# Patient Record
Sex: Female | Born: 1990 | Race: Black or African American | Hispanic: No | Marital: Single | Smoking: Never smoker
Health system: Southern US, Community
[De-identification: ages and names within clinical notes are randomized; demographics above are authoritative.]

## PROBLEM LIST (undated history)

## (undated) HISTORY — PX: KNEE SURGERY: SHX244

---
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
## 2018-12-05 ENCOUNTER — Telehealth: Payer: Self-pay

## 2018-12-05 NOTE — Telephone Encounter (Signed)
TC with patient re: +PPD. Discussed need to review medical hx and send for CXR. Appt scheduled for 8:20 12/06/18 Aileen Fass, RN

## 2018-12-08 ENCOUNTER — Telehealth: Payer: Self-pay

## 2018-12-08 NOTE — Telephone Encounter (Signed)
TC from patient. +PPD/EPI appt scheduled for 12/12/18 Aileen Fass, RN

## 2018-12-08 NOTE — Telephone Encounter (Signed)
TB RN received fax from [facility] on 12/04/18 d/t a +PPD (73mm).  TC to patient re: a missed +PPD appt on 12/06/18.  Patient stated she needed something before 1pm today. RN offered 11am work-in, patient declined.  Patient then stated she would get her work schedule and call TB RN back to schedule next week. Aileen Fass, RN

## 2018-12-12 ENCOUNTER — Ambulatory Visit (LOCAL_COMMUNITY_HEALTH_CENTER): Payer: Medicaid Other

## 2018-12-12 ENCOUNTER — Other Ambulatory Visit: Payer: Self-pay

## 2018-12-12 VITALS — Ht 71.0 in | Wt 240.0 lb

## 2018-12-12 DIAGNOSIS — R7611 Nonspecific reaction to tuberculin skin test without active tuberculosis: Secondary | ICD-10-CM

## 2018-12-12 NOTE — Progress Notes (Signed)
Patient referred from [facility] for 22mm PPD on 12/03/18; states has always had some redness from skin tests in the past.  No induration from PPD placement at todays visit but patient did have bruising.  Discussed LTBI and Active TB. Referred for CXR at [facility]. Patient unsure when she will go for this. Aileen Fass, RN

## 2018-12-20 ENCOUNTER — Telehealth: Payer: Self-pay

## 2018-12-22 NOTE — Telephone Encounter (Signed)
TC with patient re: CXR. Patient reports has not gone for CXR because she has been home with her children.  Stressed importance of going for CXR soon. Patient verbalized understanding. Aileen Fass, RN

## 2019-01-03 ENCOUNTER — Other Ambulatory Visit: Payer: Self-pay

## 2019-01-03 ENCOUNTER — Ambulatory Visit
Admission: RE | Admit: 2019-01-03 | Discharge: 2019-01-03 | Disposition: A | Discharge status: Home / Self Care | Payer: Medicaid Other | Source: Ambulatory Visit | Attending: Family Medicine | Admitting: Family Medicine

## 2019-01-03 DIAGNOSIS — R7611 Nonspecific reaction to tuberculin skin test without active tuberculosis: Secondary | ICD-10-CM

## 2019-01-12 ENCOUNTER — Telehealth: Payer: Self-pay

## 2019-01-23 DIAGNOSIS — Z227 Latent tuberculosis: Secondary | ICD-10-CM | POA: Insufficient documentation

## 2019-01-23 NOTE — Telephone Encounter (Signed)
Mailed TB Employer letter to patient. Closing to TB f/u due to patient not returning calls. Aileen Fass, RN

## 2019-06-16 ENCOUNTER — Other Ambulatory Visit: Payer: Self-pay

## 2019-06-16 ENCOUNTER — Encounter: Payer: Self-pay | Admitting: Emergency Medicine

## 2019-06-16 ENCOUNTER — Ambulatory Visit
Admission: EM | Admit: 2019-06-16 | Discharge: 2019-06-16 | Disposition: A | Discharge status: Home / Self Care | Payer: Medicaid Other | Attending: Family Medicine | Admitting: Family Medicine

## 2019-06-16 DIAGNOSIS — O219 Vomiting of pregnancy, unspecified: Secondary | ICD-10-CM | POA: Insufficient documentation

## 2019-06-16 LAB — URINALYSIS, COMPLETE (UACMP) WITH MICROSCOPIC
Bilirubin Urine: NEGATIVE
Glucose, UA: NEGATIVE mg/dL
Hgb urine dipstick: NEGATIVE
Ketones, ur: NEGATIVE mg/dL
Leukocytes,Ua: NEGATIVE
Nitrite: NEGATIVE
RBC / HPF: NONE SEEN RBC/hpf (ref 0–5)
Specific Gravity, Urine: 1.025 (ref 1.005–1.030)
pH: 6.5 (ref 5.0–8.0)

## 2019-06-16 NOTE — Discharge Instructions (Signed)
Vitamin B6 (pyridoxine) 25mg  take 1 tablet 3 to 4 times daily Unisom (doxylamine) 25mg  take half a tablet 3 to 4 times daily Ginger Await urine culture

## 2019-06-16 NOTE — ED Triage Notes (Signed)
Patient c/o abdominal pain, back pain, nausea, and vomiting that started a week.  Patient states that she is [redacted] weeks pregnant.

## 2019-06-16 NOTE — ED Provider Notes (Signed)
[facility]    CSN: [id number] Arrival date & time: 06/16/19  1144      History   Chief Complaint Chief Complaint  Patient presents with  . Abdominal Pain  . Emesis  . Back Pain    HPI Suzanne Cuevas is a 29 y.o. female.   29 yo female with a c/o nausea, vomiting, back pain and abdominal pain. Denies any fevers, chills, hematemesis, dysuria, hematuria, vaginal bleeding. States has had some mild constipation. Patient is [redacted] weeks pregnant and states her OB called in an rx for Diclegis (doxilamine/pyridoxine) yesterday for nausea/vomiting but patient did not fill due to cost.   Abdominal Pain Associated symptoms: vomiting   Emesis Associated symptoms: abdominal pain   Back Pain Associated symptoms: abdominal pain     History reviewed. No pertinent past medical history.  Patient Active Problem List   Diagnosis Date Noted  . Latent tuberculosis 01/23/2019    Past Surgical History:  Procedure Laterality Date  . KNEE SURGERY Bilateral     OB History    Gravida  1   Para      Term      Preterm      AB      Living        SAB      TAB      Ectopic      Multiple      Live Births               Home Medications    Prior to Admission medications   Medication Sig Start Date End Date Taking? Authorizing Provider  Prenatal Vit-Fe Fumarate-FA (PRENATAL VITAMIN PO) Take by mouth.   Yes [provider]    Family History Family History  Problem Relation Age of Onset  . Healthy Mother   . Healthy Father     Social History Social History   Tobacco Use  . Smoking status: Never Smoker  . Smokeless tobacco: Never Used  Substance Use Topics  . Alcohol use: Not Currently    Comment: occasional  . Drug use: Not Currently     Allergies   Codeine and Penicillins   Review of Systems Review of Systems  Gastrointestinal: Positive for abdominal pain and vomiting.  Musculoskeletal: Positive for back pain.     Physical  Exam Triage Vital Signs ED Triage Vitals  Enc Vitals Group     BP 06/16/19 1201 119/86     Pulse Rate 06/16/19 1201 84     Resp 06/16/19 1201 16     Temp 06/16/19 1201 98.4 F (36.9 C)     Temp Source 06/16/19 1201 Oral     SpO2 06/16/19 1201 100 %     Weight 06/16/19 1156 220 lb (99.8 kg)     Height 06/16/19 1156 5\' 11"  (1.803 m)     Head Circumference --      Peak Flow --      Pain Score 06/16/19 1156 8     Pain Loc --      Pain Edu? --      Excl. in [place]? --    No data found.  Updated Vital Signs BP 119/86 (BP Location: Right Arm)   Pulse 84   Temp 98.4 F (36.9 C) (Oral)   Resp 16   Ht 5\' 11"  (1.803 m)   Wt 99.8 kg   LMP 12/05/2018 (Exact Date)   SpO2 100%   BMI 30.68 kg/m   Visual Acuity  Right Eye Distance:   Left Eye Distance:   Bilateral Distance:    Right Eye Near:   Left Eye Near:    Bilateral Near:     Physical Exam Vitals and nursing note reviewed.  Constitutional:      General: She is not in acute distress.    Appearance: She is not toxic-appearing or diaphoretic.  Cardiovascular:     Rate and Rhythm: Normal rate.  Pulmonary:     Effort: Pulmonary effort is normal. No respiratory distress.  Abdominal:     General: Bowel sounds are normal. There is no distension.     Palpations: Abdomen is soft. There is no mass.     Tenderness: There is abdominal tenderness (mild; diffuse). There is no right CVA tenderness, left CVA tenderness, guarding or rebound.     Hernia: No hernia is present.  Neurological:     Mental Status: She is alert.      UC Treatments / Results  Labs (all labs ordered are listed, but only abnormal results are displayed) Labs Reviewed  URINALYSIS, COMPLETE (UACMP) WITH MICROSCOPIC - Abnormal; Notable for the following components:      Result Value   APPearance CLOUDY (*)    Protein, ur TRACE (*)    Bacteria, UA RARE (*)    All other components within normal limits  URINE CULTURE    EKG   Radiology No results  found.  Procedures Procedures (including critical care time)  Medications Ordered in UC Medications - No data to display  Initial Impression / Assessment and Plan / UC Course  I have reviewed the triage vital signs and the nursing notes.  Pertinent labs & imaging results that were available during my care of the patient were reviewed by me and considered in my medical decision making (see chart for details).      Final Clinical Impressions(s) / UC Diagnoses   Final diagnoses:  Nausea and vomiting in pregnancy     Discharge Instructions     Vitamin B6 (pyridoxine) 25mg  take 1 tablet 3 to 4 times daily Unisom (doxylamine) 25mg  take half a tablet 3 to 4 times daily Ginger Await urine culture    ED Prescriptions    None      1. Lab results and diagnosis reviewed with patient 2. Recommend supportive treatment as above with otc pyridoxine and doxylamine 3. Await urine culture 4. Follow up with OB 5. Follow-up prn    PDMP not reviewed this encounter.   , MD 06/16/19 1258

## 2019-06-18 LAB — URINE CULTURE
Culture: NO GROWTH
Special Requests: NORMAL

## 2020-05-03 IMAGING — CR DG CHEST 1V
1 series · 1 of 1 positions shown · non-contrast
Comparison: None.

CLINICAL DATA: Positive tuberculin skin test

EXAM:
CHEST  1 VIEW

[dg chest 1 view]
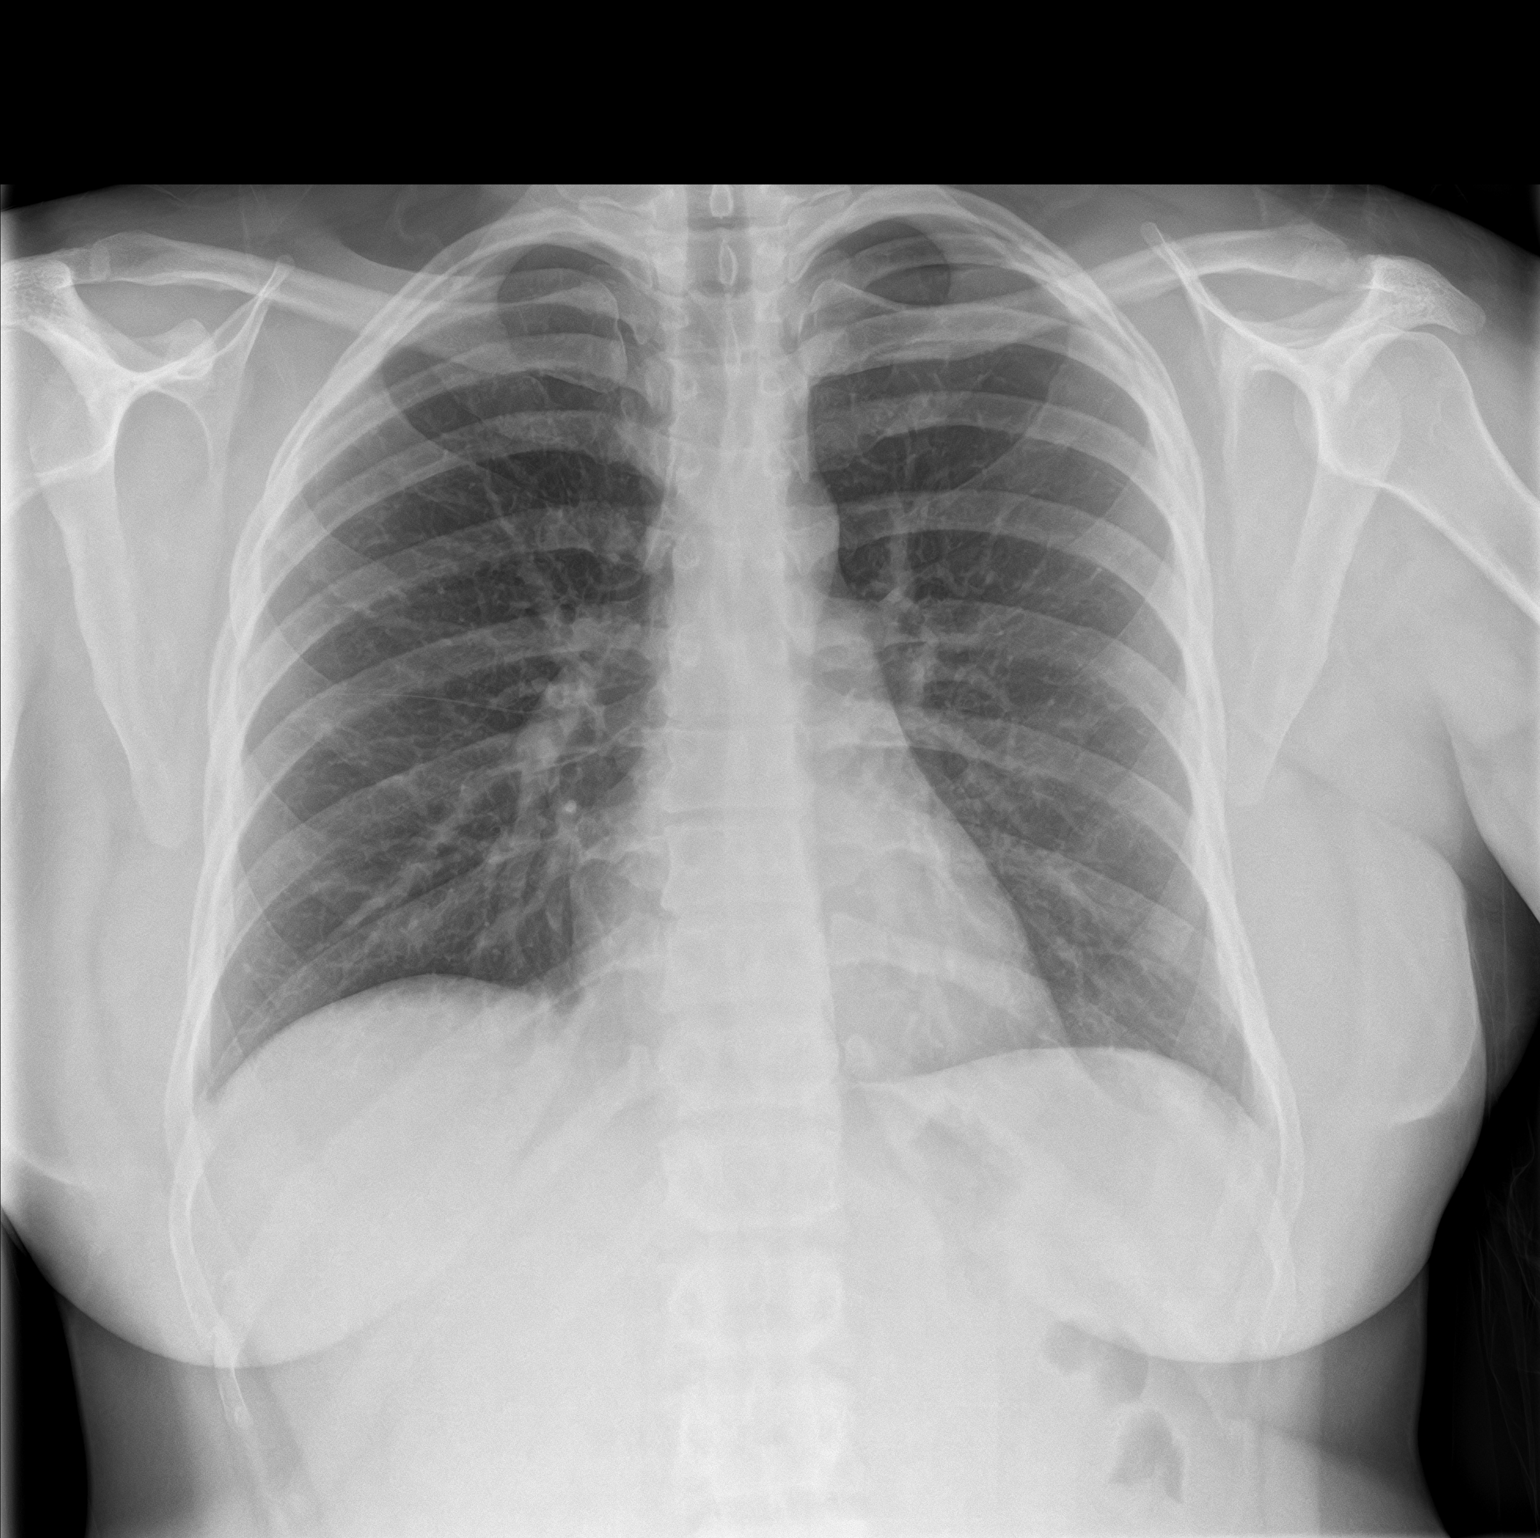

[1 of 1 positions shown; findings below may reference images not displayed]

FINDINGS: Lungs are clear. Heart size and pulmonary vascularity are normal. No
adenopathy. No bone lesions.
IMPRESSION: No abnormality noted.

## 2023-09-30 ENCOUNTER — Ambulatory Visit
Admission: EM | Admit: 2023-09-30 | Discharge: 2023-09-30 | Disposition: A | Discharge status: Home / Self Care | Attending: Family Medicine | Admitting: Family Medicine

## 2023-09-30 ENCOUNTER — Encounter: Payer: Self-pay | Admitting: Emergency Medicine

## 2023-09-30 DIAGNOSIS — J069 Acute upper respiratory infection, unspecified: Secondary | ICD-10-CM | POA: Insufficient documentation

## 2023-09-30 LAB — GROUP A STREP BY PCR: Group A Strep by PCR: NOT DETECTED

## 2023-09-30 LAB — SARS CORONAVIRUS 2 BY RT PCR: SARS Coronavirus 2 by RT PCR: NEGATIVE

## 2023-09-30 MED ORDER — IBUPROFEN 800 MG PO TABS
800.0000 mg | ORAL_TABLET | Freq: Once | ORAL | Status: AC
  Administered 2023-09-30: 800 mg via ORAL

## 2023-09-30 MED ORDER — PROMETHAZINE-DM 6.25-15 MG/5ML PO SYRP: 5.0000 mL | ORAL_SOLUTION | Freq: Four times a day (QID) | ORAL | 0 refills | Status: AC | PRN

## 2023-09-30 NOTE — Discharge Instructions (Addendum)
 Cloma D Dinh's strep and COVID are negative. You have a viral respiratory infection that will gradually improve over the next 7-10 days. Cough may last up to 3 weeks.   If your were prescribed medication, stop by the pharmacy to pick them up.   You can take Tylenol and/or Ibuprofen as needed for fever reduction and pain relief.    For cough: honey 1/2 to 1 teaspoon (you can dilute the honey in water or another fluid).  Stop at the pharmacy to pick up your prescription cough medication. You can use a humidifier for chest congestion and cough.  If you don't have a humidifier, you can sit in the bathroom with the hot shower running.      For sore throat: try warm salt water gargles, Mucinex sore throat cough drops or cepacol lozenges, throat spray, warm tea or water with lemon/honey, popsicles or ice, or OTC cold relief medicine for throat discomfort. You can also purchase chloraseptic spray at the pharmacy or dollar store.   For congestion: take a daily anti-histamine like Zyrtec, Claritin, and a oral decongestant, such as pseudoephedrine.  You can also use Flonase 1-2 sprays in each nostril daily. Afrin is also a good option, if you do not have high blood pressure.    It is important to stay hydrated: drink plenty of fluids (water, gatorade/powerade/pedialyte, juices, or teas) to keep your throat moisturized and help further relieve irritation/discomfort.    Return or go to the Emergency Department if symptoms worsen or do not improve in the next few days

## 2023-09-30 NOTE — ED Triage Notes (Signed)
 Patient c/o sore throat, sinus congestion, and headache that started on Wed.  Patient reports possible fever yesterday.

## 2023-09-30 NOTE — ED Provider Notes (Signed)
 [facility]    CSN: [id number] Arrival date & time: 09/30/23  1004      History   Chief Complaint Chief Complaint  Patient presents with   Sore Throat   Sinus Problem    HPI Suzanne Cuevas is a 33 y.o. female.   HPI  History obtained from the patient. Suzanne Cuevas presents for nasal congestion, sore throat, ear pain, productive cough with green sputum, headache and fever. Symptoms started on Wednesday but fever was 101 F last night. Took Mucinex, Theraflu and used an humidifier without relief. Denies rhinorrhea, vomiting or diarrhea.  Her son who attends daycare and her boss have have been sick.     History reviewed. No pertinent past medical history.  Patient Active Problem List   Diagnosis Date Noted   Latent tuberculosis 01/23/2019    Past Surgical History:  Procedure Laterality Date   KNEE SURGERY Bilateral     OB History     Gravida  1   Para      Term      Preterm      AB      Living         SAB      IAB      Ectopic      Multiple      Live Births               Home Medications    Prior to Admission medications   Medication Sig Start Date End Date Taking? Authorizing Provider  promethazine-dextromethorphan (PROMETHAZINE-DM) 6.25-15 MG/5ML syrup Take 5 mLs by mouth 4 (four) times daily as needed. 09/30/23  Yes Nandan Willems, DO  Prenatal Vit-Fe Fumarate-FA (PRENATAL VITAMIN PO) Take by mouth.    [provider]    Family History Family History  Problem Relation Age of Onset   Healthy Mother    Healthy Father     Social History Social History   Tobacco Use   Smoking status: Never   Smokeless tobacco: Never  Vaping Use   Vaping status: Never Used  Substance Use Topics   Alcohol use: Not Currently    Comment: occasional   Drug use: Not Currently     Allergies   Codeine and Penicillins   Review of Systems Review of Systems: negative unless otherwise stated in HPI.      Physical  Exam Triage Vital Signs ED Triage Vitals  Encounter Vitals Group     BP 09/30/23 1017 103/74     Girls Systolic BP Percentile --      Girls Diastolic BP Percentile --      Boys Systolic BP Percentile --      Boys Diastolic BP Percentile --      Pulse Rate 09/30/23 1017 (!) 106     Resp 09/30/23 1017 15     Temp 09/30/23 1017 98.6 F (37 C)     Temp Source 09/30/23 1017 Oral     SpO2 09/30/23 1017 97 %     Weight 09/30/23 1015 220 lb 0.3 oz (99.8 kg)     Height 09/30/23 1015 5' 11 (1.803 m)     Head Circumference --      Peak Flow --      Pain Score 09/30/23 1015 5     Pain Loc --      Pain Education --      Exclude from Growth Chart --    No data found.  Updated Vital Signs BP 103/74 (  BP Location: Left Arm)   Pulse (!) 106   Temp 98.6 F (37 C) (Oral)   Resp 15   Ht 5' 11 (1.803 m)   Wt 99.8 kg   LMP 09/09/2023 (Approximate)   SpO2 97%   Breastfeeding No   BMI 30.69 kg/m   Visual Acuity Right Eye Distance:   Left Eye Distance:   Bilateral Distance:    Right Eye Near:   Left Eye Near:    Bilateral Near:     Physical Exam GEN:     alert, ill but non-toxic appearing female in no distress    HENT:  mucus membranes moist, oropharyngeal without lesions, moderate erythema, no tonsils visible,  moderate erythematous edematous turbinates, bilateral TM normal EYES:   no scleral injection or discharge NECK:  normal ROM, no meningismus   RESP:  no increased work of breathing, clear to auscultation bilaterally CVS:   regular rate and rhythm Skin:   warm and dry    UC Treatments / Results  Labs (all labs ordered are listed, but only abnormal results are displayed) Labs Reviewed  GROUP A STREP BY PCR  SARS CORONAVIRUS 2 BY RT PCR    EKG   Radiology No results found.  Procedures Procedures (including critical care time)  Medications Ordered in UC Medications  ibuprofen (ADVIL) tablet 800 mg (800 mg Oral Given 09/30/23 1108)    Initial Impression /  Assessment and Plan / UC Course  I have reviewed the triage vital signs and the nursing notes.  Pertinent labs & imaging results that were available during my care of the patient were reviewed by me and considered in my medical decision making (see chart for details).       Pt is a 33 y.o. female who presents for 2 days of respiratory symptoms.Given ibuprofen for throat and ear pain.  Suzanne Cuevas is afebrile here without recent antipyretics. Satting well on room air. Overall pt is ill but non-toxic appearing, well hydrated, without respiratory distress. Pulmonary exam is unremarkable.  COVID was negative. Strep PCR is negative.  History most consistent with viral respiratory illness. Discussed symptomatic treatment.  Explained lack of efficacy of antibiotics in viral disease.  Promethazine DM for cough. Typical duration of symptoms discussed. Work note provided.   Return and ED precautions given and voiced understanding. Discussed MDM, treatment plan and plan for follow-up with patient who agrees with plan.     Final Clinical Impressions(s) / UC Diagnoses   Final diagnoses:  Viral URI with cough     Discharge Instructions      Suzanne Cuevas's strep and COVID are negative. You have a viral respiratory infection that will gradually improve over the next 7-10 days. Cough may last up to 3 weeks.   If your were prescribed medication, stop by the pharmacy to pick them up.   You can take Tylenol and/or Ibuprofen as needed for fever reduction and pain relief.    For cough: honey 1/2 to 1 teaspoon (you can dilute the honey in water or another fluid).  Stop at the pharmacy to pick up your prescription cough medication. You can use a humidifier for chest congestion and cough.  If you don't have a humidifier, you can sit in the bathroom with the hot shower running.      For sore throat: try warm salt water gargles, Mucinex sore throat cough drops or cepacol lozenges, throat spray, warm tea  or water with lemon/honey, popsicles or ice, or OTC cold  relief medicine for throat discomfort. You can also purchase chloraseptic spray at the pharmacy or dollar store.   For congestion: take a daily anti-histamine like Zyrtec, Claritin, and a oral decongestant, such as pseudoephedrine.  You can also use Flonase 1-2 sprays in each nostril daily. Afrin is also a good option, if you do not have high blood pressure.    It is important to stay hydrated: drink plenty of fluids (water, gatorade/powerade/pedialyte, juices, or teas) to keep your throat moisturized and help further relieve irritation/discomfort.    Return or go to the Emergency Department if symptoms worsen or do not improve in the next few days      ED Prescriptions     Medication Sig Dispense Auth. Provider   promethazine-dextromethorphan (PROMETHAZINE-DM) 6.25-15 MG/5ML syrup Take 5 mLs by mouth 4 (four) times daily as needed. 118 mL Deyon Chizek, DO      PDMP not reviewed this encounter.   Lauree Yurick, DO 09/30/23 1112
